# Patient Record
Sex: Female | Born: 2000 | Hispanic: Yes | Marital: Single | Smoking: Never smoker
Health system: Southern US, Community
[De-identification: ages and names within clinical notes are randomized; demographics above are authoritative.]

## PROBLEM LIST (undated history)

## (undated) DIAGNOSIS — N3010 Interstitial cystitis (chronic) without hematuria: Secondary | ICD-10-CM

---

## 2020-06-18 ENCOUNTER — Other Ambulatory Visit: Payer: Self-pay

## 2020-06-18 ENCOUNTER — Emergency Department (HOSPITAL_COMMUNITY)
Admission: EM | Admit: 2020-06-18 | Discharge: 2020-06-18 | Disposition: A | Discharge status: Home / Self Care | Payer: Medicaid - Out of State

## 2020-06-18 NOTE — ED Notes (Signed)
Pt states she would go to urgent care in the AM

## 2020-11-18 ENCOUNTER — Other Ambulatory Visit: Payer: Self-pay

## 2020-11-18 ENCOUNTER — Emergency Department (HOSPITAL_COMMUNITY): Payer: Medicaid - Out of State

## 2020-11-18 ENCOUNTER — Encounter (HOSPITAL_COMMUNITY): Payer: Self-pay | Admitting: Emergency Medicine

## 2020-11-18 ENCOUNTER — Emergency Department (HOSPITAL_COMMUNITY)
Admission: EM | Admit: 2020-11-18 | Discharge: 2020-11-18 | Disposition: A | Discharge status: Home / Self Care | Payer: Medicaid - Out of State | Attending: Emergency Medicine | Admitting: Emergency Medicine

## 2020-11-18 DIAGNOSIS — R1011 Right upper quadrant pain: Secondary | ICD-10-CM

## 2020-11-18 HISTORY — DX: Interstitial cystitis (chronic) without hematuria: N30.10

## 2020-11-18 LAB — URINALYSIS, ROUTINE W REFLEX MICROSCOPIC
Bilirubin Urine: NEGATIVE
Glucose, UA: NEGATIVE mg/dL
Ketones, ur: 80 mg/dL — AB
Leukocytes,Ua: NEGATIVE
Nitrite: NEGATIVE
Protein, ur: NEGATIVE mg/dL
Specific Gravity, Urine: 1.024 (ref 1.005–1.030)
pH: 5 (ref 5.0–8.0)

## 2020-11-18 LAB — I-STAT BETA HCG BLOOD, ED (MC, WL, AP ONLY): I-stat hCG, quantitative: <5 m[IU]/mL (ref <5)

## 2020-11-18 LAB — COMPREHENSIVE METABOLIC PANEL
ALT: 14 U/L (ref 0–44)
AST: 18 U/L (ref 15–41)
Albumin: 4.6 g/dL (ref 3.5–5.0)
Alkaline Phosphatase: 45 U/L (ref 38–126)
Anion gap: 9 (ref 5–15)
BUN: 17 mg/dL (ref 6–20)
CO2: 23 mmol/L (ref 22–32)
Calcium: 9.4 mg/dL (ref 8.9–10.3)
Chloride: 107 mmol/L (ref 98–111)
Creatinine, Ser: 0.64 mg/dL (ref 0.44–1.00)
GFR, Estimated: >60 mL/min (ref >60)
Glucose, Bld: 83 mg/dL (ref 70–99)
Potassium: 3.9 mmol/L (ref 3.5–5.1)
Sodium: 139 mmol/L (ref 135–145)
Total Bilirubin: 0.8 mg/dL (ref 0.3–1.2)
Total Protein: 7.8 g/dL (ref 6.5–8.1)

## 2020-11-18 LAB — CBC
HCT: 40.9 % (ref 36.0–46.0)
Hemoglobin: 13.4 g/dL (ref 12.0–15.0)
MCH: 31.2 pg (ref 26.0–34.0)
MCHC: 32.8 g/dL (ref 30.0–36.0)
MCV: 95.1 fL (ref 80.0–100.0)
Platelets: 311 10*3/uL (ref 150–400)
RBC: 4.3 MIL/uL (ref 3.87–5.11)
RDW: 12.4 % (ref 11.5–15.5)
WBC: 8.3 10*3/uL (ref 4.0–10.5)
nRBC: 0 % (ref 0.0–0.2)

## 2020-11-18 LAB — PREGNANCY, URINE: Preg Test, Ur: NEGATIVE

## 2020-11-18 LAB — LIPASE, BLOOD: Lipase: 25 U/L (ref 11–51)

## 2020-11-18 MED ORDER — ONDANSETRON 4 MG PO TBDP: 4.0000 mg | ORAL_TABLET | Freq: Three times a day (TID) | ORAL | 0 refills | Status: AC | PRN

## 2020-11-18 MED ORDER — IOHEXOL 300 MG/ML  SOLN
100.0000 mL | Freq: Once | INTRAMUSCULAR | Status: AC | PRN
  Administered 2020-11-18: 100 mL via INTRAVENOUS

## 2020-11-18 MED ORDER — ALUM & MAG HYDROXIDE-SIMETH 200-200-20 MG/5ML PO SUSP
30.0000 mL | Freq: Once | ORAL | Status: AC
  Administered 2020-11-18: 30 mL via ORAL
  Filled 2020-11-18: qty 30

## 2020-11-18 MED ORDER — SODIUM CHLORIDE 0.9 % IV BOLUS
500.0000 mL | Freq: Once | INTRAVENOUS | Status: AC
  Administered 2020-11-18: 500 mL via INTRAVENOUS

## 2020-11-18 MED ORDER — PANTOPRAZOLE SODIUM 20 MG PO TBEC: 20.0000 mg | DELAYED_RELEASE_TABLET | Freq: Every day | ORAL | 0 refills | Status: AC

## 2020-11-18 MED ORDER — PANTOPRAZOLE SODIUM 40 MG IV SOLR
40.0000 mg | Freq: Once | INTRAVENOUS | Status: AC
  Administered 2020-11-18: 40 mg via INTRAVENOUS
  Filled 2020-11-18: qty 40

## 2020-11-18 MED ORDER — FENTANYL CITRATE (PF) 100 MCG/2ML IJ SOLN
50.0000 ug | Freq: Once | INTRAMUSCULAR | Status: AC
  Administered 2020-11-18: 50 ug via INTRAVENOUS
  Filled 2020-11-18: qty 2

## 2020-11-18 MED ORDER — FAMOTIDINE 20 MG PO TABS: 20.0000 mg | ORAL_TABLET | Freq: Two times a day (BID) | ORAL | 0 refills | Status: AC

## 2020-11-18 MED ORDER — FAMOTIDINE IN NACL 20-0.9 MG/50ML-% IV SOLN
20.0000 mg | Freq: Once | INTRAVENOUS | Status: AC
  Administered 2020-11-18: 20 mg via INTRAVENOUS
  Filled 2020-11-18: qty 50

## 2020-11-18 MED ORDER — SODIUM CHLORIDE 0.9 % IV BOLUS
1000.0000 mL | Freq: Once | INTRAVENOUS | Status: AC
  Administered 2020-11-18: 1000 mL via INTRAVENOUS

## 2020-11-18 MED ORDER — LIDOCAINE VISCOUS HCL 2 % MT SOLN
15.0000 mL | Freq: Once | OROMUCOSAL | Status: AC
  Administered 2020-11-18: 15 mL via ORAL
  Filled 2020-11-18: qty 15

## 2020-11-18 MED ORDER — ONDANSETRON HCL 4 MG/2ML IJ SOLN
4.0000 mg | Freq: Once | INTRAMUSCULAR | Status: AC
  Administered 2020-11-18: 4 mg via INTRAVENOUS
  Filled 2020-11-18: qty 2

## 2020-11-18 NOTE — ED Triage Notes (Signed)
Patient c/o right-sided abdominal pain for approximately 3 days. She also endorses nausea x3 days and states she had one episode of emesis this morning. Denies any urinary symptoms. Denies fever or chills. Reports last BM was either yesterday or the day before. She states the pain resolves when she passes gas but then comes back.   Took tylenol over the last few days w/ some relief.

## 2020-11-18 NOTE — ED Provider Notes (Signed)
[facility]-EMERGENCY DEPT Provider Note   CSN: [id number] Arrival date & time: 11/18/20  1541     History Chief Complaint  Patient presents with  . Abdominal Pain    Rachel Pacheco is a 20 y.o. female history of interstitial cystitis and GERD.  Patient presents today for right upper abdominal pain onset 3 days ago reports pain began in the morning no clear inciting event she reports pain has been dull constant worsened with eating, no alleviating factors, no radiation of pain. She reports this morning when she tried to eat pain worsened and she had 1 episode of nonbloody emesis. She denies similar problems in the past.  Denies fever/chills, chest pain/shortness of breath, cough, pleurisy, flank pain, back pain, sore throat, dysuria/hematuria, diarrhea, vaginal bleeding/discharge or any additional concerns.  HPI     Past Medical History:  Diagnosis Date  . Interstitial cystitis     There are no problems to display for this patient.   History reviewed. No pertinent surgical history.   OB History   No obstetric history on file.     History reviewed. No pertinent family history.  Social History   Tobacco Use  . Smoking status: Never Smoker  . Smokeless tobacco: Never Used  Substance Use Topics  . Alcohol use: Never  . Drug use: Never    Home Medications Prior to Admission medications   Medication Sig Start Date End Date Taking? Authorizing Provider  acetaminophen (TYLENOL) 500 MG tablet Take 1,000 mg by mouth every 6 (six) hours as needed for headache (pain/cramps).   Yes [provider]  famotidine (PEPCID) 20 MG tablet Take 1 tablet (20 mg total) by mouth 2 (two) times daily. 11/18/20  Yes Harlene Salts A, PA-C  ibuprofen (ADVIL) 200 MG tablet Take 400 mg by mouth every 6 (six) hours as needed for cramping or headache (pain).   Yes [provider]  ondansetron (ZOFRAN ODT) 4 MG disintegrating tablet Take 1 tablet (4 mg  total) by mouth every 8 (eight) hours as needed for nausea or vomiting. 11/18/20  Yes Harlene Salts A, PA-C  pantoprazole (PROTONIX) 20 MG tablet Take 1 tablet (20 mg total) by mouth daily. 11/18/20  Yes Harlene Salts A, PA-C  pentosan polysulfate (ELMIRON) 100 MG capsule Take 100 mg by mouth 2 (two) times daily.   Yes [provider]    Allergies    Patient has no known allergies.  Review of Systems   Review of Systems Ten systems are reviewed and are negative for acute change except as noted in the HPI  Physical Exam Updated Vital Signs BP 113/74   Pulse (!) 109   Temp 98.8 F (37.1 C) (Oral)   Resp 15   Ht 5\' 4"  (1.626 m)   Wt 54.4 kg   LMP 10/16/2020 (Exact Date)   SpO2 100%   BMI 20.60 kg/m   Physical Exam Constitutional:      General: She is not in acute distress.    Appearance: Normal appearance. She is well-developed. She is not ill-appearing or diaphoretic.  HENT:     Head: Normocephalic and atraumatic.  Eyes:     General: Vision grossly intact. Gaze aligned appropriately.     Pupils: Pupils are equal, round, and reactive to light.  Neck:     Trachea: Trachea and phonation normal.  Pulmonary:     Effort: Pulmonary effort is normal. No respiratory distress.  Abdominal:     General: There is no distension.  Palpations: Abdomen is soft.     Tenderness: There is abdominal tenderness in the right upper quadrant. There is no guarding or rebound. Negative signs include Rovsing's sign and McBurney's sign.  Genitourinary:    Comments: Deferred by patient Musculoskeletal:        General: Normal range of motion.     Cervical back: Normal range of motion.  Skin:    General: Skin is warm and dry.  Neurological:     Mental Status: She is alert.     GCS: GCS eye subscore is 4. GCS verbal subscore is 5. GCS motor subscore is 6.     Comments: Speech is clear and goal oriented, follows commands Major Cranial nerves without deficit, no facial droop Moves  extremities without ataxia, coordination intact  Psychiatric:        Behavior: Behavior normal.     ED Results / Procedures / Treatments   Labs (all labs ordered are listed, but only abnormal results are displayed) Labs Reviewed  URINALYSIS, ROUTINE W REFLEX MICROSCOPIC - Abnormal; Notable for the following components:      Result Value   APPearance HAZY (*)    Hgb urine dipstick MODERATE (*)    Ketones, ur 80 (*)    Bacteria, UA RARE (*)    All other components within normal limits  LIPASE, BLOOD  COMPREHENSIVE METABOLIC PANEL  CBC  PREGNANCY, URINE  I-STAT BETA HCG BLOOD, ED ([facility], [facility], [facility] ONLY)    EKG None  Radiology CT ABDOMEN PELVIS W CONTRAST  Result Date: 11/18/2020 CLINICAL DATA:  Right-sided abdominal pain. EXAM: CT ABDOMEN AND PELVIS WITH CONTRAST TECHNIQUE: Multidetector CT imaging of the abdomen and pelvis was performed using the standard protocol following bolus administration of intravenous contrast. CONTRAST:  100mL OMNIPAQUE IOHEXOL 300 MG/ML  SOLN COMPARISON:  None. FINDINGS: Lower chest: No acute abnormality. Hepatobiliary: A 1.2 cm diameter cyst is seen within the posterior aspect of the right lobe of the liver. No gallstones, gallbladder wall thickening, or biliary dilatation. Pancreas: Unremarkable. No pancreatic ductal dilatation or surrounding inflammatory changes. Spleen: Normal in size without focal abnormality. Adrenals/Urinary Tract: Adrenal glands are unremarkable. Kidneys are normal, without renal calculi, focal lesion, or hydronephrosis. Bladder is unremarkable. Stomach/Bowel: Stomach is within normal limits. Appendix appears normal. No evidence of bowel wall thickening, distention, or inflammatory changes. Vascular/Lymphatic: No significant vascular findings are present. No enlarged abdominal or pelvic lymph nodes. Reproductive: Uterus and bilateral adnexa are unremarkable. Other: No abdominal wall hernia or abnormality. A trace amount of pelvic free fluid is  seen. Musculoskeletal: No acute or significant osseous findings. IMPRESSION: 1. Small hepatic cyst. 2. Trace amount of pelvic free fluid, likely physiologic. Electronically Signed   By: Aram Candelahaddeus  Houston M.D.   On: 11/18/2020 21:26   US Abdomen Limited RUQ (LIVER/GB)  Result Date: 11/18/2020 CLINICAL DATA:  Right upper quadrant pain for 2 days with nausea and vomiting. EXAM: ULTRASOUND ABDOMEN LIMITED RIGHT UPPER QUADRANT COMPARISON:  None. FINDINGS: Gallbladder: No gallstones or wall thickening visualized. No sonographic Murphy sign noted by sonographer. Common bile duct: Diameter: Normal, 3 mm. Liver: No focal lesion identified. Within normal limits in parenchymal echogenicity. Portal vein is patent on color Doppler imaging with normal direction of blood flow towards the liver. Other: None. IMPRESSION: Normal right upper quadrant ultrasound. No explanation for patient's symptoms. Electronically Signed   By: Jeronimo GreavesKyle  Talbot M.D.   On: 11/18/2020 18:29    Procedures Procedures   Medications Ordered in ED Medications  sodium chloride  0.9 % bolus 1,000 mL (0 mLs Intravenous Stopped 11/18/20 1818)  fentaNYL (SUBLIMAZE) injection 50 mcg (50 mcg Intravenous Given 11/18/20 1657)  ondansetron (ZOFRAN) injection 4 mg (4 mg Intravenous Given 11/18/20 1712)  pantoprazole (PROTONIX) injection 40 mg (40 mg Intravenous Given 11/18/20 1911)  alum & mag hydroxide-simeth (MAALOX/MYLANTA) 200-200-20 MG/5ML suspension 30 mL (30 mLs Oral Given 11/18/20 1951)    And  lidocaine (XYLOCAINE) 2 % viscous mouth solution 15 mL (15 mLs Oral Given 11/18/20 1951)  famotidine (PEPCID) IVPB 20 mg premix (0 mg Intravenous Stopped 11/18/20 2023)  sodium chloride 0.9 % bolus 500 mL (0 mLs Intravenous Stopped 11/18/20 2023)  fentaNYL (SUBLIMAZE) injection 50 mcg (50 mcg Intravenous Given 11/18/20 2101)  iohexol (OMNIPAQUE) 300 MG/ML solution 100 mL (100 mLs Intravenous Contrast Given 11/18/20 2109)    ED Course  I have reviewed the triage vital  signs and the nursing notes.  Pertinent labs & imaging results that were available during my care of the patient were reviewed by me and considered in my medical decision making (see chart for details).    MDM Rules/Calculators/A&P                         Additional history obtained from: 1. Nursing notes from this visit. 2. Electronic medical record review. ============== 20 year old female presented for right upper quadrant abdominal pain for the past 3 days this is worsened with eating, she had 1 episode of emesis this morning. No infectious symptoms no other complaints. She is moderately tender in the right upper quadrant. Will obtain abdominal pain labs, urinalysis and ultrasound of the right upper quadrant. - I ordered, reviewed and interpreted labs which include: Pregnancy test negative. CMP within normal limits, no emergent electrolyte derangement, AKI, LFT elevations or gap. Lipase within normal limits. CBC within normal limits, no leukocytosis to suggest infectious process, no anemia. Urinalysis shows 80 ketones and moderate hemoglobin, suspect secondary to dehydration, no evidence of UTI. Patient not diabetic, doubt DKA.  RUQ [place]:    IMPRESSION:  Normal right upper quadrant ultrasound. No explanation for patient's  symptoms.  - Patient was reassessed she reports minimal improvement following IV fluids, Zofran and Protonix. Shared decision making made with patient I offered to CT abdomen pelvis for further evaluation. She declined. Will give GI cocktail and Pepcid for suspected acid reflux as cause of patient's symptoms today and monitor - 8:55 PM: Patient reassessed reports pain is now moved she reports right upper quadrant pain and now reporting some right mid and right lower quadrant pain as well. Shared decision-making again made with patient and she elects to obtain CT abdomen pelvis for further evaluation. Discussed risk versus benefits of CT imaging with patient and she  states understanding. - CTAP:  IMPRESSION:  1. Small hepatic cyst.  2. Trace amount of pelvic free fluid, likely physiologic.  - Patient reassessed she is resting comfortably in bed no acute distress tolerating p.o. without difficulty.  She states understanding of findings as above and is requesting discharge.  Will refer patient to gastroenterologist for further evaluation and treatment as well as to her PCP.  Will start patient on Pepcid and Protonix for suspected reflux etiology.  Additional possibility of gastroenteritis as patient reported a small episode of diarrhea during visit earlier.  Encourage patient to maintain water hydration and get plenty of rest.  Will give patient ODT Zofran for nausea.  No evidence for appendicitis, cholecystitis, biliary obstruction, perforation, kidney stone,  SBO, torsion or other emergent intra-abdominal pathologies.  Additionally patient without respiratory symptoms flank pain pleurisies or back pain to suggest PE, pneumonia or other intrathoracic causes of abdominal pain today.  At this time there does not appear to be any evidence of an acute emergency medical condition and the patient appears stable for discharge with appropriate outpatient follow up. Diagnosis was discussed with patient who verbalizes understanding of care plan and is agreeable to discharge. I have discussed return precautions with patient who verbalizes understanding. Patient encouraged to follow-up with their PCP and Gastroenterology. All questions answered.  Patient's case discussed with Dr. Effie Shy who agrees with plan to discharge with follow-up.   Note: Portions of this report may have been transcribed using voice recognition software. Every effort was made to ensure accuracy; however, inadvertent computerized transcription errors may still be present. Final Clinical Impression(s) / ED Diagnoses Final diagnoses:  RUQ abdominal pain    Rx / DC Orders ED Discharge Orders          Ordered    ondansetron (ZOFRAN ODT) 4 MG disintegrating tablet  Every 8 hours PRN        11/18/20 2141    pantoprazole (PROTONIX) 20 MG tablet  Daily        11/18/20 2141    famotidine (PEPCID) 20 MG tablet  2 times daily        11/18/20 2141           Elizabeth Palau 11/18/20 2147    Mancel Bale, MD 11/19/20 [phone]

## 2020-11-18 NOTE — Discharge Instructions (Signed)
At this time there does not appear to be the presence of an emergent medical condition, however there is always the potential for conditions to change. Please read and follow the below instructions.  Please return to the Emergency Department immediately for any new or worsening symptoms. Please be sure to follow up with your Primary Care Provider within one week regarding your visit today; please call their office to schedule an appointment even if you are feeling better for a follow-up visit. Please take the medications Pepcid and Protonix to help with possible acid reflux related symptoms.  Please drink plenty water and get plenty of rest.  Please call the specialist at [facility] gastroenterology tomorrow to schedule a follow-up appointment for further evaluation. Your CT scan today showed a small liver cyst please discuss this with your primary care provider and your gastroenterologist at follow-up appointment. You may use the medication Zofran as prescribed to help with nausea and vomiting.  Go to the nearest Emergency Department immediately if: You have fever or chills You cannot stop vomiting. Your pain is only in areas of your belly, such as the right side or the left lower part of the belly. You have bloody or black poop, or poop that looks like tar. You have very bad pain, cramping, or bloating in your belly. You have signs of not having enough fluid or water in your body (dehydration), such as: Dark pee, very little pee, or no pee. Cracked lips. Dry mouth. Sunken eyes. Sleepiness. Weakness. You have trouble breathing or chest pain. You have any new/concerning or worsening of symptoms  Please read the additional information packets attached to your discharge summary.  Do not take your medicine if  develop an itchy rash, swelling in your mouth or lips, or difficulty breathing; call 911 and seek immediate emergency medical attention if this occurs.  You may review your lab tests and  imaging results in their entirety on your MyChart account.  Please discuss all results of fully with your primary care provider and other specialist at your follow-up visit.  Note: Portions of this text may have been transcribed using voice recognition software. Every effort was made to ensure accuracy; however, inadvertent computerized transcription errors may still be present.

## 2020-12-23 ENCOUNTER — Other Ambulatory Visit: Payer: Self-pay

## 2020-12-23 ENCOUNTER — Emergency Department (HOSPITAL_COMMUNITY)
Admission: EM | Admit: 2020-12-23 | Discharge: 2020-12-23 | Disposition: A | Discharge status: Home / Self Care | Payer: Medicaid - Out of State | Attending: Emergency Medicine | Admitting: Emergency Medicine

## 2020-12-23 ENCOUNTER — Encounter (HOSPITAL_COMMUNITY): Payer: Self-pay | Admitting: Emergency Medicine

## 2020-12-23 DIAGNOSIS — G43909 Migraine, unspecified, not intractable, without status migrainosus: Secondary | ICD-10-CM | POA: Diagnosis not present

## 2020-12-23 DIAGNOSIS — R519 Headache, unspecified: Secondary | ICD-10-CM | POA: Diagnosis present

## 2020-12-23 MED ORDER — PROCHLORPERAZINE EDISYLATE 10 MG/2ML IJ SOLN
10.0000 mg | Freq: Once | INTRAMUSCULAR | Status: AC
  Administered 2020-12-23: 10 mg via INTRAMUSCULAR
  Filled 2020-12-23: qty 2

## 2020-12-23 MED ORDER — KETOROLAC TROMETHAMINE 60 MG/2ML IM SOLN
60.0000 mg | Freq: Once | INTRAMUSCULAR | Status: AC
  Administered 2020-12-23: 60 mg via INTRAMUSCULAR
  Filled 2020-12-23: qty 2

## 2020-12-23 NOTE — ED Provider Notes (Signed)
[facility]-EMERGENCY DEPT Provider Note   CSN: [id number] Arrival date & time: 12/23/20  0417     History Chief Complaint  Patient presents with  . Migraine    Rachel Pacheco is a 20 y.o. female.  The history is provided by the patient and medical records.  Migraine Associated symptoms include headaches.    20 year old female presenting to the ED with headache.  States began 12/21/2020 after working out with her boyfriend at the gym.  Pain since then has been waxing and waning in severity and seems to be moving around her head (initially forehead, yesterday right side, now top of her head).  States she is able to sleep, work, and carry on her usual activities.  She has not had any nausea or vomiting.  No periods of confusion, blurred vision, focal numbness or weakness, difficulty walking, or changes in speech.  Does report history of migraine headaches but usually resolves with Excedrin.  She has taken this for the past 2 days with mild improvement but not complete resolution of headache.  She is not on anticoagulation.    Past Medical History:  Diagnosis Date  . Interstitial cystitis     There are no problems to display for this patient.   History reviewed. No pertinent surgical history.   OB History   No obstetric history on file.     History reviewed. No pertinent family history.  Social History   Tobacco Use  . Smoking status: Never Smoker  . Smokeless tobacco: Never Used  Substance Use Topics  . Alcohol use: Never  . Drug use: Never    Home Medications Prior to Admission medications   Medication Sig Start Date End Date Taking? Authorizing Provider  acetaminophen (TYLENOL) 500 MG tablet Take 1,000 mg by mouth every 6 (six) hours as needed for headache (pain/cramps).    [provider]  famotidine (PEPCID) 20 MG tablet Take 1 tablet (20 mg total) by mouth 2 (two) times daily. 11/18/20   Harlene Salts A, PA-C  ibuprofen  (ADVIL) 200 MG tablet Take 400 mg by mouth every 6 (six) hours as needed for cramping or headache (pain).    [provider]  ondansetron (ZOFRAN ODT) 4 MG disintegrating tablet Take 1 tablet (4 mg total) by mouth every 8 (eight) hours as needed for nausea or vomiting. 11/18/20   Harlene Salts A, PA-C  pantoprazole (PROTONIX) 20 MG tablet Take 1 tablet (20 mg total) by mouth daily. 11/18/20   Harlene Salts A, PA-C  pentosan polysulfate (ELMIRON) 100 MG capsule Take 100 mg by mouth 2 (two) times daily.    [provider]    Allergies    Patient has no known allergies.  Review of Systems   Review of Systems  Neurological: Positive for headaches.  All other systems reviewed and are negative.   Physical Exam Updated Vital Signs BP 135/87 (BP Location: Left Arm)   Pulse (!) 112   Temp 98.5 F (36.9 C) (Oral)   Resp 13   Ht 5\' 4"  (1.626 m)   Wt 58.5 kg   LMP 11/26/2020   SpO2 98%   BMI 22.14 kg/m   Physical Exam Vitals and nursing note reviewed.  Constitutional:      General: She is not in acute distress.    Appearance: She is well-developed. She is not diaphoretic.  HENT:     Head: Normocephalic and atraumatic.     Right Ear: External ear normal.  Left Ear: External ear normal.  Eyes:     Conjunctiva/sclera: Conjunctivae normal.     Pupils: Pupils are equal, round, and reactive to light.  Neck:     Comments: No rigidity, no meningismus Cardiovascular:     Rate and Rhythm: Normal rate and regular rhythm.     Heart sounds: Normal heart sounds. No murmur heard.   Pulmonary:     Effort: Pulmonary effort is normal. No respiratory distress.     Breath sounds: Normal breath sounds. No wheezing or rhonchi.  Abdominal:     General: Bowel sounds are normal.     Palpations: Abdomen is soft.     Tenderness: There is no abdominal tenderness. There is no guarding.  Musculoskeletal:        General: Normal range of motion.     Cervical back: Full passive  range of motion without pain, normal range of motion and neck supple. No rigidity.  Skin:    General: Skin is warm and dry.     Findings: No rash.  Neurological:     Mental Status: She is alert and oriented to person, place, and time.     Cranial Nerves: No cranial nerve deficit.     Sensory: No sensory deficit.     Motor: No tremor or seizure activity.     Comments: AAOx3, answering questions and following commands appropriately; equal strength UE and LE bilaterally; CN grossly intact; moves all extremities appropriately without ataxia; no focal neuro deficits or facial asymmetry appreciated  Psychiatric:        Behavior: Behavior normal.        Thought Content: Thought content normal.     ED Results / Procedures / Treatments   Labs (all labs ordered are listed, but only abnormal results are displayed) Labs Reviewed - No data to display  EKG None  Radiology No results found.  Procedures Procedures   Medications Ordered in ED Medications  ketorolac (TORADOL) injection 60 mg (has no administration in time range)  prochlorperazine (COMPAZINE) injection 10 mg (has no administration in time range)    ED Course  I have reviewed the triage vital signs and the nursing notes.  Pertinent labs & imaging results that were available during my care of the patient were reviewed by me and considered in my medical decision making (see chart for details).    MDM Rules/Calculators/A&P  20 year old female here with headache for the past 2 days after strenuous workout with boyfriend.  Some improvement with Excedrin but not complete resolution.  She is awake, alert, appropriately oriented.  She has no focal neurologic deficits, nuchal rigidity, or other abnormal findings.  She is afebrile with stable VS.  Exam is not concerning for CVA, TIA, ICH, or meningitis.  Suspect possible exercise induced headache.  Will give medication here and reassess.  5:59 AM Headache improved after medications.   VSS.  Patient feels she can rest comfortably at home.  Remains neurologically intact.  Stable for discharge.  Can continue symptomatic care at home.  Close follow-up with PCP.  Return here for new concerns.  Final Clinical Impression(s) / ED Diagnoses Final diagnoses:  Bad headache    Rx / DC Orders ED Discharge Orders    None       Garlon Hatchet, PA-C 12/23/20 0602    Mesner, Barbara Cower, MD 12/23/20 [phone]

## 2020-12-23 NOTE — Discharge Instructions (Signed)
Can continue tylenol, motrin, or excedrine as needed for headache. Make sure to rest today, drink lots of water. Follow-up with your primary care doctor. Return here for new concerns.

## 2020-12-23 NOTE — ED Triage Notes (Signed)
Patient complaining of a migraine that started over 40 hrs ago. Patient states that nothing is working.

## 2021-10-11 ENCOUNTER — Other Ambulatory Visit: Payer: Self-pay

## 2021-10-11 ENCOUNTER — Encounter (HOSPITAL_COMMUNITY): Payer: Self-pay | Admitting: Emergency Medicine

## 2021-10-11 ENCOUNTER — Emergency Department (HOSPITAL_COMMUNITY)
Admission: EM | Admit: 2021-10-11 | Discharge: 2021-10-11 | Disposition: A | Discharge status: Home / Self Care | Payer: Medicaid Other | Attending: Emergency Medicine | Admitting: Emergency Medicine

## 2021-10-11 DIAGNOSIS — G2581 Restless legs syndrome: Secondary | ICD-10-CM | POA: Diagnosis present

## 2021-10-11 LAB — CBC WITH DIFFERENTIAL/PLATELET
Abs Immature Granulocytes: 0.01 10*3/uL (ref 0.00–0.07)
Basophils Absolute: 0 10*3/uL (ref 0.0–0.1)
Basophils Relative: 1 %
Eosinophils Absolute: 0 10*3/uL (ref 0.0–0.5)
Eosinophils Relative: 0 %
HCT: 45.2 % (ref 36.0–46.0)
Hemoglobin: 14.5 g/dL (ref 12.0–15.0)
Immature Granulocytes: 0 %
Lymphocytes Relative: 23 %
Lymphs Abs: 1.3 10*3/uL (ref 0.7–4.0)
MCH: 29.9 pg (ref 26.0–34.0)
MCHC: 32.1 g/dL (ref 30.0–36.0)
MCV: 93.2 fL (ref 80.0–100.0)
Monocytes Absolute: 0.3 10*3/uL (ref 0.1–1.0)
Monocytes Relative: 5 %
Neutro Abs: 3.9 10*3/uL (ref 1.7–7.7)
Neutrophils Relative %: 71 %
Platelets: 377 10*3/uL (ref 150–400)
RBC: 4.85 MIL/uL (ref 3.87–5.11)
RDW: 12.4 % (ref 11.5–15.5)
WBC: 5.5 10*3/uL (ref 4.0–10.5)
nRBC: 0 % (ref 0.0–0.2)

## 2021-10-11 LAB — URINALYSIS, ROUTINE W REFLEX MICROSCOPIC
Bilirubin Urine: NEGATIVE
Glucose, UA: NEGATIVE mg/dL
Ketones, ur: 20 mg/dL — AB
Leukocytes,Ua: NEGATIVE
Nitrite: NEGATIVE
Protein, ur: NEGATIVE mg/dL
Specific Gravity, Urine: 1.02 (ref 1.005–1.030)
pH: 7 (ref 5.0–8.0)

## 2021-10-11 LAB — BASIC METABOLIC PANEL
Anion gap: 8 (ref 5–15)
BUN: 9 mg/dL (ref 6–20)
CO2: 25 mmol/L (ref 22–32)
Calcium: 9.8 mg/dL (ref 8.9–10.3)
Chloride: 105 mmol/L (ref 98–111)
Creatinine, Ser: 0.67 mg/dL (ref 0.44–1.00)
GFR, Estimated: >60 mL/min (ref >60)
Glucose, Bld: 113 mg/dL — ABNORMAL HIGH (ref 70–99)
Potassium: 3.8 mmol/L (ref 3.5–5.1)
Sodium: 138 mmol/L (ref 135–145)

## 2021-10-11 LAB — PREGNANCY, URINE: Preg Test, Ur: NEGATIVE

## 2021-10-11 LAB — MAGNESIUM: Magnesium: 2.1 mg/dL (ref 1.7–2.4)

## 2021-10-11 LAB — PHOSPHORUS: Phosphorus: 3.8 mg/dL (ref 2.5–4.6)

## 2021-10-11 MED ORDER — DIPHENHYDRAMINE HCL 25 MG PO TABS: 25.0000 mg | ORAL_TABLET | Freq: Every evening | ORAL | 0 refills | Status: AC | PRN

## 2021-10-11 MED ORDER — METHOCARBAMOL 500 MG PO TABS: 500.0000 mg | ORAL_TABLET | Freq: Two times a day (BID) | ORAL | 0 refills | Status: AC | PRN

## 2021-10-11 MED ORDER — METHOCARBAMOL 500 MG PO TABS
1000.0000 mg | ORAL_TABLET | Freq: Once | ORAL | Status: AC
  Administered 2021-10-11: 1000 mg via ORAL
  Filled 2021-10-11: qty 2

## 2021-10-11 MED ORDER — DIPHENHYDRAMINE HCL 25 MG PO CAPS
25.0000 mg | ORAL_CAPSULE | Freq: Once | ORAL | Status: AC
  Administered 2021-10-11: 25 mg via ORAL
  Filled 2021-10-11: qty 1

## 2021-10-11 NOTE — ED Triage Notes (Addendum)
Patient states restless leg symptoms since approx 0100. Patient states this has happened before but she has not been seen for it. Patient states bilateral sensation. Medication for seizures started recently. Patient states legs are aching. Patient is ambulatory with a steady gait. Denies back pain, no neurodeficits

## 2021-10-11 NOTE — ED Provider Notes (Signed)
[facility]-EMERGENCY DEPT Provider Note   CSN: [id number] Arrival date & time: 10/11/21  1859     History  Chief Complaint  Patient presents with   Restless Legs    Rachel Pacheco is a 21 y.o. female presenting to the ED with complaint of restless leg.  Patient reports she has had episodes of restless legs in the past, but it seemed to really flared up the past 2 days, she cannot sleep.  She was started on hydroxyzine by her urologist for interstitial cystitis recently, and feels that she started his medication just before the symptoms began, wonders whether it may be a trigger.  She says she has not been able to sleep at night, which is what prompted her ED visit.  HPI     Home Medications Prior to Admission medications   Medication Sig Start Date End Date Taking? Authorizing Provider  diphenhydrAMINE (BENADRYL) 25 MG tablet Take 1 tablet (25 mg total) by mouth at bedtime as needed for up to 30 doses for sleep. 10/11/21  Yes Idania Desouza, Kermit Balo, MD  methocarbamol (ROBAXIN) 500 MG tablet Take 1 tablet (500 mg total) by mouth 2 (two) times daily as needed for up to 20 doses for muscle spasms. 10/11/21  Yes Sofya Moustafa, Kermit Balo, MD  acetaminophen (TYLENOL) 500 MG tablet Take 1,000 mg by mouth every 6 (six) hours as needed for headache (pain/cramps).    [provider]  famotidine (PEPCID) 20 MG tablet Take 1 tablet (20 mg total) by mouth 2 (two) times daily. 11/18/20   Harlene Salts A, PA-C  ibuprofen (ADVIL) 200 MG tablet Take 400 mg by mouth every 6 (six) hours as needed for cramping or headache (pain).    [provider]  ondansetron (ZOFRAN ODT) 4 MG disintegrating tablet Take 1 tablet (4 mg total) by mouth every 8 (eight) hours as needed for nausea or vomiting. 11/18/20   Harlene Salts A, PA-C  pantoprazole (PROTONIX) 20 MG tablet Take 1 tablet (20 mg total) by mouth daily. 11/18/20   Harlene Salts A, PA-C  pentosan polysulfate (ELMIRON) 100  MG capsule Take 100 mg by mouth 2 (two) times daily.    [provider]      Allergies    Lemon flavor    Review of Systems   Review of Systems  Physical Exam Updated Vital Signs BP 128/86    Pulse (!) 101    Temp 98.6 F (37 C) (Oral)    Resp 18    Ht 5\' 4"  (1.626 m)    Wt 51.7 kg    SpO2 99%    BMI 19.57 kg/m  Physical Exam Constitutional:      General: She is not in acute distress. HENT:     Head: Normocephalic and atraumatic.  Eyes:     Conjunctiva/sclera: Conjunctivae normal.     Pupils: Pupils are equal, round, and reactive to light.  Cardiovascular:     Rate and Rhythm: Normal rate and regular rhythm.  Pulmonary:     Effort: Pulmonary effort is normal. No respiratory distress.  Skin:    General: Skin is warm and dry.  Neurological:     General: No focal deficit present.     Mental Status: She is alert and oriented to person, place, and time. Mental status is at baseline.  Psychiatric:        Mood and Affect: Mood normal.        Behavior: Behavior normal.    ED  Results / Procedures / Treatments   Labs (all labs ordered are listed, but only abnormal results are displayed) Labs Reviewed  URINALYSIS, ROUTINE W REFLEX MICROSCOPIC - Abnormal; Notable for the following components:      Result Value   Hgb urine dipstick SMALL (*)    Ketones, ur 20 (*)    Bacteria, UA RARE (*)    All other components within normal limits  BASIC METABOLIC PANEL - Abnormal; Notable for the following components:   Glucose, Bld 113 (*)    All other components within normal limits  PREGNANCY, URINE  MAGNESIUM  PHOSPHORUS  CBC WITH DIFFERENTIAL/PLATELET    EKG None  Radiology No results found.  Procedures Procedures    Medications Ordered in ED Medications  methocarbamol (ROBAXIN) tablet 1,000 mg (1,000 mg Oral Given 10/11/21 2239)  diphenhydrAMINE (BENADRYL) capsule 25 mg (25 mg Oral Given 10/11/21 2239)    ED Course/ Medical Decision Making/ A&P                            Medical Decision Making Risk OTC drugs. Prescription drug management.   Patient is here with bilateral restless leg syndrome.  I doubt DVT clinically.  I checked her electrolyte levels which are unremarkable within normal limits.  Doubt sciatica or neurological problem.  We can try muscle relaxer as well as some Benadryl, advised that she stop the hydroxyzine which she may be reacting to.  She verbalized understanding.  We will give her a dose here and then discharge her with some medications.        Final Clinical Impression(s) / ED Diagnoses Final diagnoses:  Restless leg syndrome    Rx / DC Orders ED Discharge Orders          Ordered    methocarbamol (ROBAXIN) 500 MG tablet  2 times daily PRN        10/11/21 2248    diphenhydrAMINE (BENADRYL) 25 MG tablet  At bedtime PRN        10/11/21 2248              Terald Sleeper, MD 10/12/21 0002

## 2021-10-11 NOTE — ED Provider Triage Note (Signed)
Emergency Medicine Provider Triage Evaluation Note  Rachel Pacheco , a 21 y.o. female  was evaluated in triage.  Pt complains of restless leg.  This started at 1-2am.  She has had restless leg before  but hasn't seen anyone for it.   She started taking hydroxyzine about a week ago, at night, started keppra on the 5th for seizures.   Physical Exam  BP 131/82    Pulse (!) 114    Temp 98.9 F (37.2 C)    Resp 18    SpO2 99%  Gen:   Awake, no distress   Resp:  Normal effort  MSK:   Moves extremities without difficulty, does appear to be moving her legs frequently.  Other:  Normal speech  Medical Decision Making  Medically screening exam initiated at 7:40 PM.  Appropriate orders placed.  Cameryn Schum was informed that the remainder of the evaluation will be completed by another provider, this initial triage assessment does not replace that evaluation, and the importance of remaining in the ED until their evaluation is complete.  Patient has recently started hydroxyzine which I suspect may be exacerbating her symptoms.  We will check urine.  And recheck HR.    Cristina Gong, [place] 10/11/21 1951

## 2022-04-03 IMAGING — CT CT ABD-PELV W/ CM
2 of 4 series · 16 of 46 positions shown, 18 images · IV contrast (omnipaque)
Comparison: None.

CLINICAL DATA: Right-sided abdominal pain.

EXAM:
CT ABDOMEN AND PELVIS WITH CONTRAST
TECHNIQUE: Multidetector CT imaging of the abdomen and pelvis was performed
using the standard protocol following bolus administration of
intravenous contrast.
CONTRAST:  100mL OMNIPAQUE IOHEXOL 300 MG/ML  SOLN

[Series 2: axial st · axial · 0.70mm/px · z∈[-470,-76]mm · 13 of 89 slices shown, 15 images]
[im 5/89  soft-tissue]
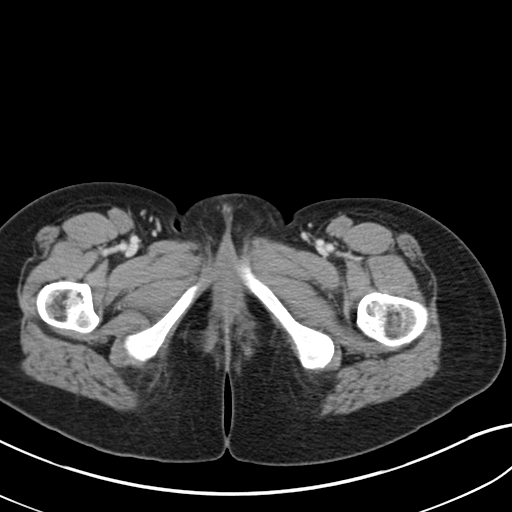
[im 5/89  bone]
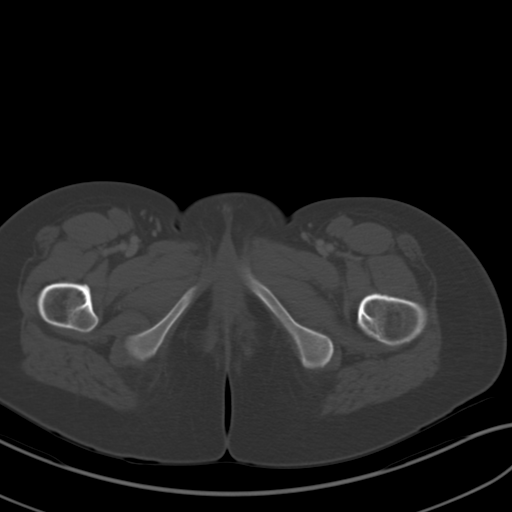
[im 14/89  soft-tissue]
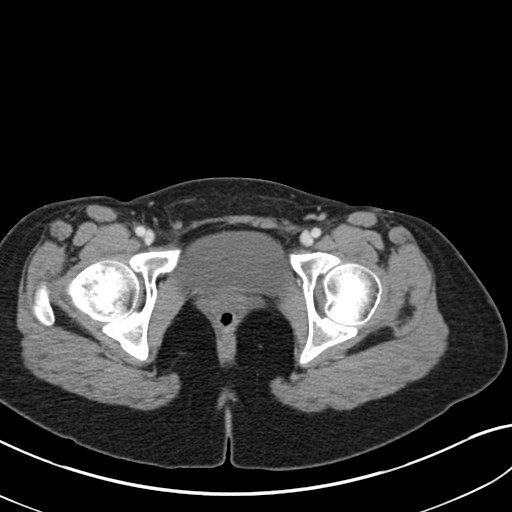
[im 19/89  soft-tissue]
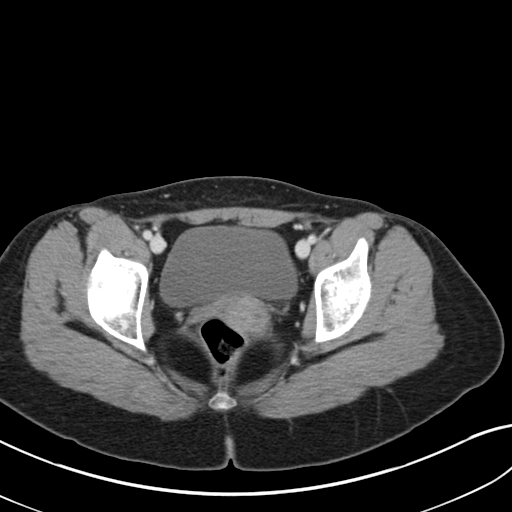
[im 24/89  soft-tissue]
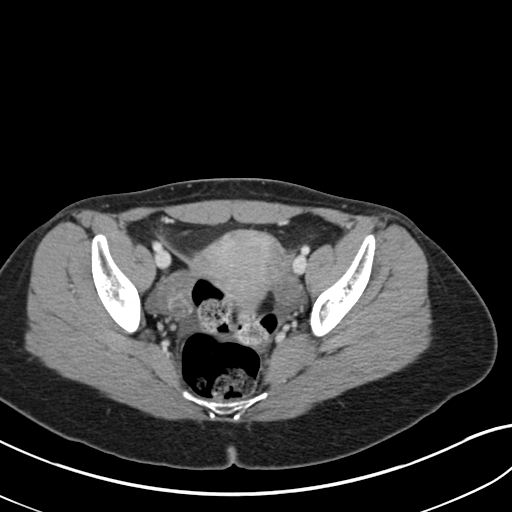
[im 33/89  soft-tissue]
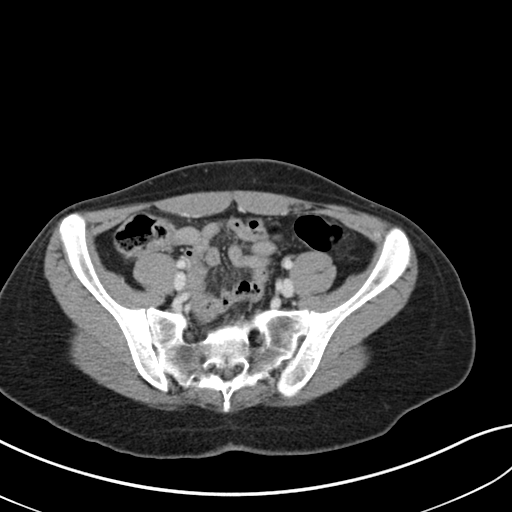
[im 38/89  soft-tissue]
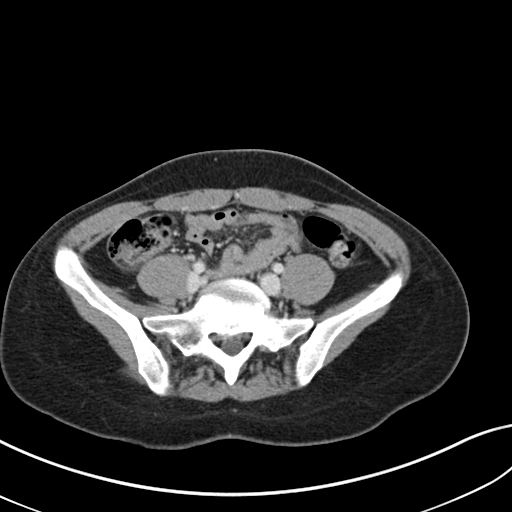
[im 47/89  soft-tissue]
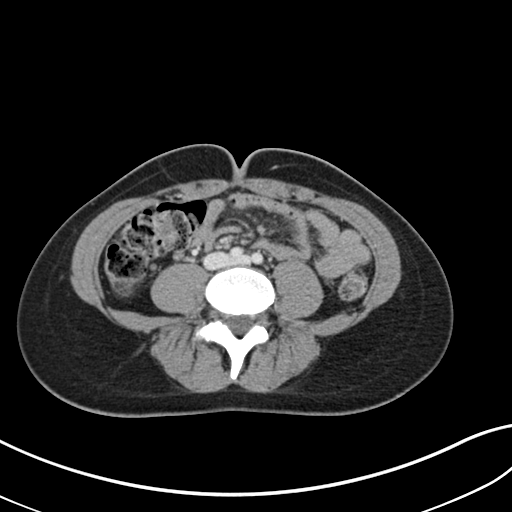
[im 51/89  soft-tissue]
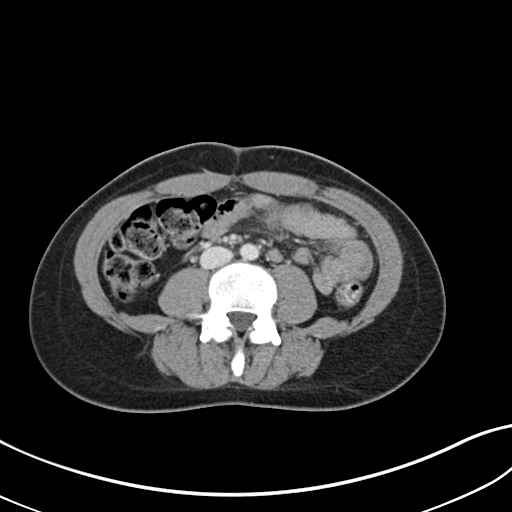
[im 56/89  soft-tissue]
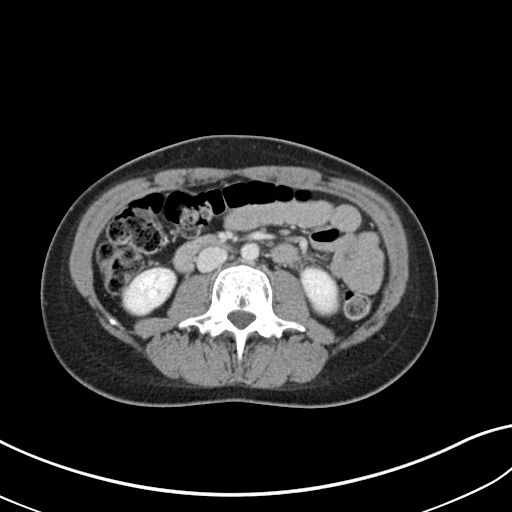
[im 56/89  bone]
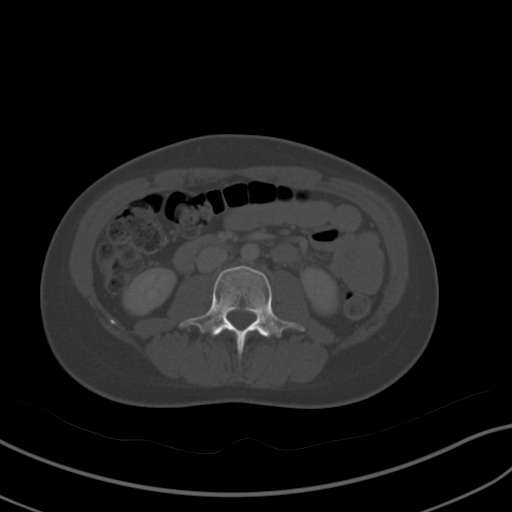
[im 65/89  soft-tissue]
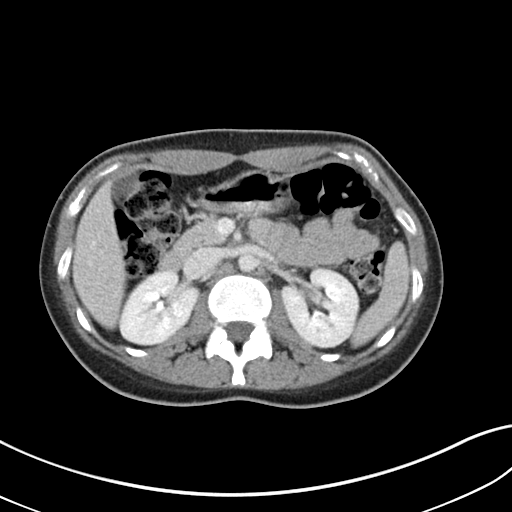
[im 70/89  soft-tissue]
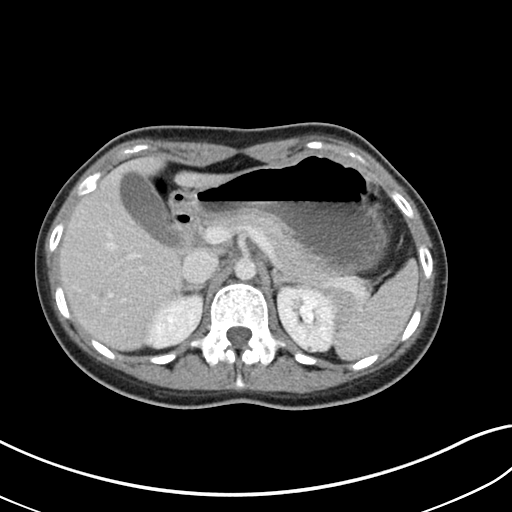
[im 75/89  soft-tissue]
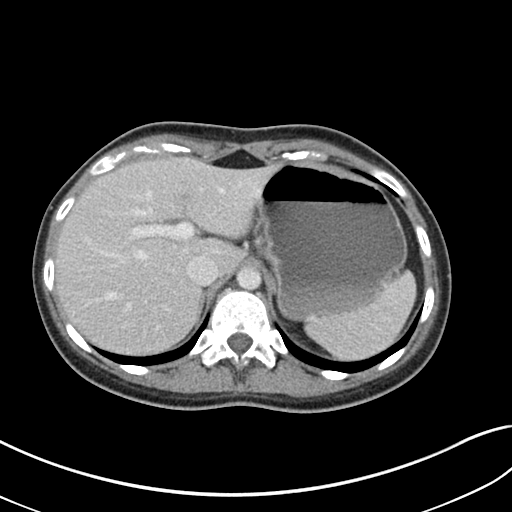
[im 84/89  soft-tissue]
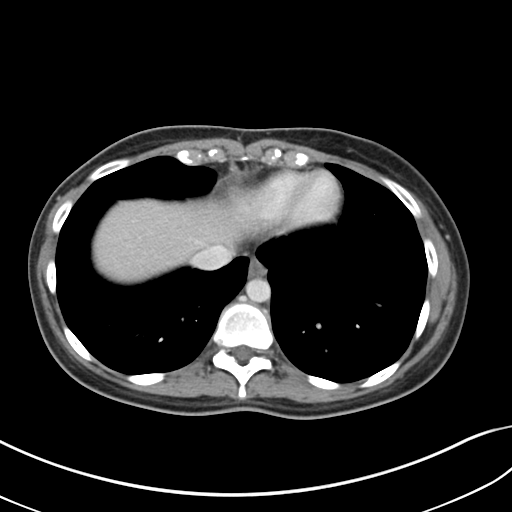

[Series 5: coronal st · coronal · 0.64mm/px · 3 of 128 slices shown]
[im 43/128  soft-tissue]
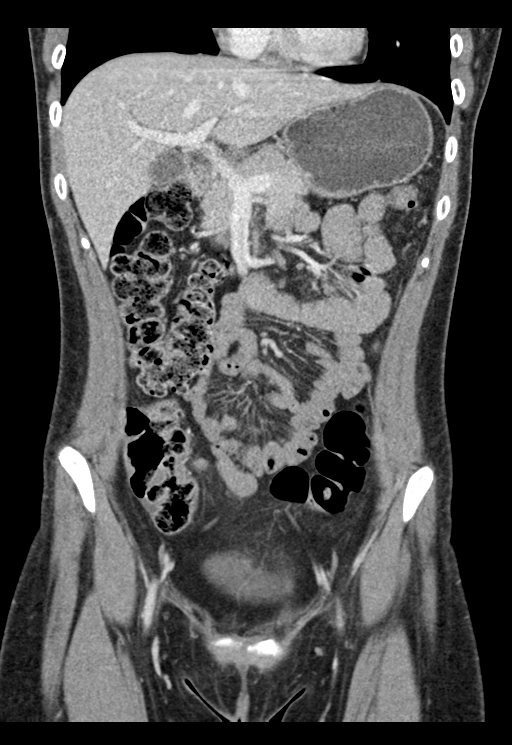
[im 57/128  soft-tissue]
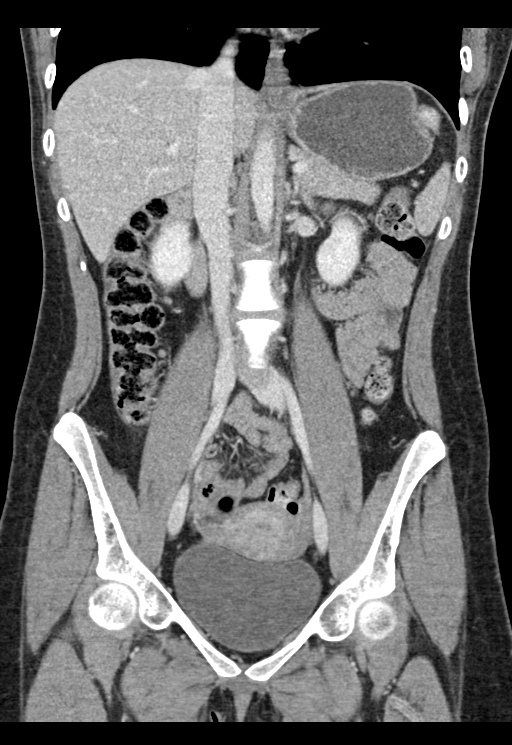
[im 71/128  soft-tissue]
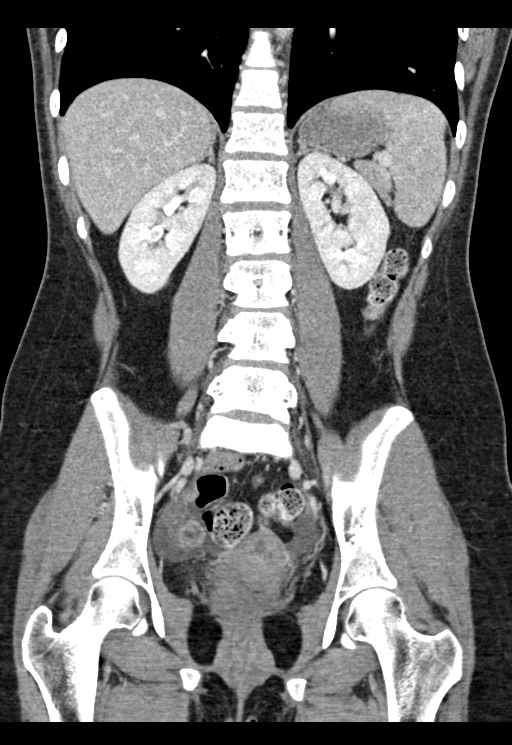

[16 of 46 positions shown; findings below may reference images not displayed]

FINDINGS: Lower chest: No acute abnormality.

Hepatobiliary: A 1.2 cm diameter cyst is seen within the posterior
aspect of the right lobe of the liver. No gallstones, gallbladder
wall thickening, or biliary dilatation.

Pancreas: Unremarkable. No pancreatic ductal dilatation or
surrounding inflammatory changes.

Spleen: Normal in size without focal abnormality.

Adrenals/Urinary Tract: Adrenal glands are unremarkable. Kidneys are
normal, without renal calculi, focal lesion, or hydronephrosis.
Bladder is unremarkable.

Stomach/Bowel: Stomach is within normal limits. Appendix appears
normal. No evidence of bowel wall thickening, distention, or
inflammatory changes.

Vascular/Lymphatic: No significant vascular findings are present. No
enlarged abdominal or pelvic lymph nodes.

Reproductive: Uterus and bilateral adnexa are unremarkable.

Other: No abdominal wall hernia or abnormality. A trace amount of
pelvic free fluid is seen.

Musculoskeletal: No acute or significant osseous findings.
IMPRESSION: 1. Small hepatic cyst.
2. Trace amount of pelvic free fluid, likely physiologic.
